# Patient Record
Sex: Female | Born: 1995 | Race: White | Hispanic: No | Marital: Single | Smoking: Current every day smoker
Health system: Southern US, Community
[De-identification: ages and names within clinical notes are randomized; demographics above are authoritative.]

---

## 2016-02-08 ENCOUNTER — Encounter (HOSPITAL_COMMUNITY): Payer: Self-pay | Admitting: *Deleted

## 2016-02-08 ENCOUNTER — Emergency Department (HOSPITAL_COMMUNITY)
Admission: EM | Admit: 2016-02-08 | Discharge: 2016-02-08 | Disposition: A | Discharge status: Home / Self Care | Payer: Self-pay | Attending: Emergency Medicine | Admitting: Emergency Medicine

## 2016-02-08 ENCOUNTER — Emergency Department (HOSPITAL_COMMUNITY): Payer: Self-pay

## 2016-02-08 DIAGNOSIS — F17210 Nicotine dependence, cigarettes, uncomplicated: Secondary | ICD-10-CM | POA: Insufficient documentation

## 2016-02-08 DIAGNOSIS — K589 Irritable bowel syndrome without diarrhea: Secondary | ICD-10-CM

## 2016-02-08 DIAGNOSIS — F419 Anxiety disorder, unspecified: Secondary | ICD-10-CM

## 2016-02-08 LAB — BASIC METABOLIC PANEL
ANION GAP: 10 (ref 5–15)
BUN: 6 mg/dL (ref 6–20)
CALCIUM: 9.7 mg/dL (ref 8.9–10.3)
CO2: 24 mmol/L (ref 22–32)
CREATININE: 0.75 mg/dL (ref 0.44–1.00)
Chloride: 108 mmol/L (ref 101–111)
Glucose, Bld: 99 mg/dL (ref 65–99)
Potassium: 3.8 mmol/L (ref 3.5–5.1)
SODIUM: 142 mmol/L (ref 135–145)

## 2016-02-08 LAB — CBC
HCT: 39 % (ref 36.0–46.0)
HEMOGLOBIN: 13.5 g/dL (ref 12.0–15.0)
MCH: 32.1 pg (ref 26.0–34.0)
MCHC: 34.6 g/dL (ref 30.0–36.0)
MCV: 92.6 fL (ref 78.0–100.0)
PLATELETS: 337 10*3/uL (ref 150–400)
RBC: 4.21 MIL/uL (ref 3.87–5.11)
RDW: 12.9 % (ref 11.5–15.5)
WBC: 8 10*3/uL (ref 4.0–10.5)

## 2016-02-08 LAB — I-STAT TROPONIN, ED: TROPONIN I, POC: 0 ng/mL (ref 0.00–0.08)

## 2016-02-08 MED ORDER — LORAZEPAM 0.5 MG PO TABS
0.5000 mg | ORAL_TABLET | Freq: Once | ORAL | Status: AC
  Administered 2016-02-08: 0.5 mg via ORAL
  Filled 2016-02-08: qty 1

## 2016-02-08 MED ORDER — DICYCLOMINE HCL 20 MG PO TABS: 20.0000 mg | ORAL_TABLET | Freq: Two times a day (BID) | ORAL | Status: AC

## 2016-02-08 MED ORDER — LORAZEPAM 1 MG PO TABS: 0.5000 mg | ORAL_TABLET | Freq: Three times a day (TID) | ORAL | Status: AC | PRN

## 2016-02-08 NOTE — Discharge Instructions (Signed)
Generalized Anxiety Disorder Generalized anxiety disorder (GAD) is a mental disorder. It interferes with life functions, including relationships, work, and school. GAD is different from normal anxiety, which everyone experiences at some point in their lives in response to specific life events and activities. Normal anxiety actually helps [place] prepare for and get through these life events and activities. Normal anxiety goes away after the event or activity is over.  GAD causes anxiety that is not necessarily related to specific events or activities. It also causes excess anxiety in proportion to specific events or activities. The anxiety associated with GAD is also difficult to control. GAD can vary from mild to severe. People with severe GAD can have intense waves of anxiety with physical symptoms (panic attacks).  SYMPTOMS The anxiety and worry associated with GAD are difficult to control. This anxiety and worry are related to many life events and activities and also occur more days than not for 6 months or longer. People with GAD also have three or more of the following symptoms (one or more in children):  Restlessness.   Fatigue.  Difficulty concentrating.   Irritability.  Muscle tension.  Difficulty sleeping or unsatisfying sleep. DIAGNOSIS GAD is diagnosed through an assessment by your health care provider. Your health care provider will ask you questions aboutyour mood,physical symptoms, and events in your life. Your health care provider may ask you about your medical history and use of alcohol or drugs, including prescription medicines. Your health care provider may also do a physical exam and blood tests. Certain medical conditions and the use of certain substances can cause symptoms similar to those associated with GAD. Your health care provider may refer you to a mental health specialist for further evaluation. TREATMENT The following therapies are usually used to treat GAD:    Medication. Antidepressant medication usually is prescribed for long-term daily control. Antianxiety medicines may be added in severe cases, especially when panic attacks occur.   Talk therapy (psychotherapy). Certain types of talk therapy can be helpful in treating GAD by providing support, education, and guidance. A form of talk therapy called cognitive behavioral therapy can teach you healthy ways to think about and react to daily life events and activities.  Stress managementtechniques. These include yoga, meditation, and exercise and can be very helpful when they are practiced regularly. A mental health specialist can help determine which treatment is best for you. Some people see improvement with one therapy. However, other people require a combination of therapies.   This information is not intended to replace advice given to you by your health care provider. Make sure you discuss any questions you have with your health care provider.   Document Released: 04/08/2013 Document Revised: 01/02/2015 Document Reviewed: 04/08/2013 Elsevier Interactive Patient Education 2016 [employer]  Irritable Bowel Syndrome, Adult Irritable bowel syndrome (IBS) is not one specific disease. It is a group of symptoms that affects the organs responsible for digestion (gastrointestinal or GI tract).  To regulate how your GI tract works, your body sends signals back and forth between your intestines and your brain. If you have IBS, there may be a problem with these signals. As a result, your GI tract does not function normally. Your intestines may become more sensitive and overreact to certain things. This is especially true when you eat certain foods or when you are under stress.  There are four types of IBS. These may be determined based on the consistency of your stool:   IBS with diarrhea.  IBS with constipation.   Mixed IBS.   Unsubtyped IBS.  It is important to know which type of IBS you  have. Some treatments are more likely to be helpful for certain types of IBS.  CAUSES  The exact cause of IBS is not known. RISK FACTORS You may have a higher risk of IBS if:  You are a woman.  You are younger than 22 years old.  You have a family history of IBS.  You have mental health problems.  You have had bacterial infection of your GI tract. SIGNS AND SYMPTOMS  Symptoms of IBS vary from person to person. The main symptom is abdominal pain or discomfort. Additional symptoms usually include one or more of the following:   Diarrhea, constipation, or both.   Abdominal swelling or bloating.   Feeling full or sick after eating a small or regular-size meal.   Frequent gas.   Mucus in the stool.   A feeling of having more stool left after a bowel movement.  Symptoms tend to come and go. They may be associated with stress, psychiatric conditions, or nothing at all.  DIAGNOSIS  There is no specific test to diagnose IBS. Your health care provider will make a diagnosis based on a physical exam, medical history, and your symptoms. You may have other tests to rule out other conditions that may be causing your symptoms. These may include:   Blood tests.   X-rays.   CT scan.  Endoscopy and colonoscopy. This is a test in which your GI tract is viewed with a long, thin, flexible tube. TREATMENT There is no cure for IBS, but treatment can help relieve symptoms. IBS treatment often includes:   Changes to your diet, such as:  Eating more fiber.  Avoiding foods that cause symptoms.  Drinking more water.  Eating regular, medium-sized portioned meals.  Medicines. These may include:  Fiber supplements if you have constipation.  Medicine to control diarrhea (antidiarrheal medicines).  Medicine to help control muscle spasms in your GI tract (antispasmodic medicines).  Medicines to help with any mental health issues, such as antidepressants or  tranquilizers.  Therapy.  Talk therapy may help with anxiety, depression, or other mental health issues that can make IBS symptoms worse.  Stress reduction.  Managing your stress can help keep symptoms under control. HOME CARE INSTRUCTIONS   Take medicines only as directed by your health care provider.  Eat a healthy diet.  Avoid foods and drinks with added sugar.  Include more whole grains, fruits, and vegetables gradually into your diet. This may be especially helpful if you have IBS with constipation.  Avoid any foods and drinks that make your symptoms worse. These may include dairy products and caffeinated or carbonated drinks.  Do not eat large meals.  Drink enough fluid to keep your urine clear or pale yellow.  Exercise regularly. Ask your health care provider for recommendations of good activities for you.  Keep all follow-up visits as directed by your health care provider. This is important. SEEK MEDICAL CARE IF:   You have constant pain.  You have trouble or pain with swallowing.  You have worsening diarrhea. SEEK IMMEDIATE MEDICAL CARE IF:   You have severe and worsening abdominal pain.   You have diarrhea and:   You have a rash, stiff neck, or severe headache.   You are irritable, sleepy, or difficult to awaken.   You are weak, dizzy, or extremely thirsty.   You have bright red blood in  your stool or you have black tarry stools.   You have unusual abdominal swelling that is painful.   You vomit continuously.   You vomit blood (hematemesis).   You have both abdominal pain and a fever.    This information is not intended to replace advice given to you by your health care provider. Make sure you discuss any questions you have with your health care provider.   Follow up with [facility] to establish primary care. Take Bentyl and Ativan as prescribed. Return to the ED if you experience severe worsening of your symptoms,  increased chest pain, difficulty breathing, loss of consciousness, dizziness, lower extremity swelling, blood in stool, abdominal pain, fever.

## 2016-02-08 NOTE — Progress Notes (Signed)
Entered in d/c instruction [facility] on 02/09/2016 [address] [place] [id number] [phone] Please use the resources provided to you in emergency room by case manager to assist with doctor for follow up On 02/11/2016 A referral for you has been sent to [facility] if you have not received a call in 3 days you may contact them Call Scherry Ran at [phone] Tuesday-Friday [email or web address] These [place] uninsured resources provide possible primary care providers, resources for discounted medications, housing, dental resources, affordable care act information, plus other resources for [facility]

## 2016-02-08 NOTE — Progress Notes (Signed)
CM spoke with pt who confirms uninsured [place] resident with no pcp.  CM discussed and provided written information for uninsured accepting pcps, discussed the importance of pcp vs EDP services for f/u care, www.needymeds.org, www.goodrx.com, discounted pharmacies and other [place] resources such as [facility] , [facility], affordable care act, financial assistance, uninsured dental services, [place] med assist, DSS and  health department  Reviewed resources for [place] uninsured accepting pcps like [facility], [facility] [address], [facility], [facility], local urgent care centers, [facility], [facility], [facility], [facility], [facility] plus others, medication resources, [facility] out patient pharmacies and housing Pt voiced understanding and appreciation of resources provided   Provided [facility] contact information Pt agreed to a referral Cm completed referral Pt to be contact by [facility] clinical liason  

## 2016-02-08 NOTE — Progress Notes (Signed)
Pt states her mother has an orange card and she is familiar with the program  Pt requested a dr noted for her and female visitor who brought her to ED CM updated ED RN and ED PA/ NP

## 2016-02-08 NOTE — ED Provider Notes (Signed)
CSN: [id number]     Arrival date & time 02/08/16  1227 History  By signing my name below, I, Placido Sou, attest that this documentation has been prepared under the direction and in the presence of [employer], PA-C. Electronically Signed: Placido Sou, ED Scribe. 02/08/2016. 5:03 PM.    Chief Complaint  Patient presents with  . Chest Pain  . Anxiety   The history is provided by the patient. No language interpreter was used.    HPI Comments: Tina Hogan is a 20 y.o. female who presents to the Emergency Department complaining of intermittent, n/v beginning 6 months ago and occuring almost daily each morning. She notes it initially began with vomiting in the mornings that has worsened to include trace amounts of blood in her vomit, decreased appetite, intermittent diarrhea, gradual weight loss, and recently began noticing mild, intermittent, centralized CP that began 1 week ago and typically presents in the mornings and during work and lasts for ~2 hours before gradually alleviating. She describes her pain as a "pressure". She typically feels better s/p vomiting. 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 She has never been dx or treated for anxiety. Pt denies having a PCP or health coverage. She denies a hx of drug or ETOH abuse. Pt denies SOB or any other symptoms at this time.    History reviewed. No pertinent past medical history. History reviewed. No pertinent past surgical history. No family history on file. Social History  Substance Use Topics  . Smoking status: Current Every Day Smoker -- 0.50 packs/day    Types: Cigarettes  . Smokeless tobacco: None  . Alcohol Use: Yes   OB History    No data available     Review of Systems A complete 10 system review of systems was obtained and all systems are negative except as noted in the HPI and PMH.   Allergies  Review of patient's allergies indicates no known allergies.  Home Medications    Prior to Admission medications   Medication Sig Start Date End Date Taking? Authorizing Provider  ibuprofen (ADVIL,MOTRIN) 200 MG tablet Take 400 mg by mouth every 6 (six) hours as needed for headache, mild pain or moderate pain.   Yes Historical Provider, MD   BP 127/75 mmHg  Pulse 90  Temp(Src) 98.2 F (36.8 C) (Oral)  Resp 18  SpO2 100%  LMP     Physical Exam  Constitutional: She is oriented to person, place, and time. She appears well-developed and well-nourished. No distress.  HENT:  Head: Normocephalic and atraumatic.  Eyes: Conjunctivae and EOM are normal. Pupils are equal, round, and reactive to light. Right eye exhibits no discharge. Left eye exhibits no discharge. No scleral icterus.  Neck: Normal range of motion. Neck supple. No JVD present.  Cardiovascular: Normal rate, regular rhythm, normal heart sounds and intact distal pulses.  Exam reveals no gallop and no friction rub.   No murmur heard. Pulmonary/Chest: Effort normal and breath sounds normal. No respiratory distress. She has no wheezes. She has no rales. She exhibits no tenderness.  Abdominal: Soft. Bowel sounds are normal. She exhibits no distension and no mass. There is no tenderness. There is no rebound and no guarding.  Neurological: She is alert and oriented to person, place, and time. She exhibits normal muscle tone. Coordination normal.  Strength 5/5 throughout. No sensory deficits.  No gait abnormality.  Skin: Skin is warm and dry. No rash noted. She is not diaphoretic. No erythema. No  pallor.  Psychiatric: She has a normal mood and affect. Her behavior is normal.  Nursing note and vitals reviewed.   ED Course  Procedures  DIAGNOSTIC STUDIES: Oxygen Saturation is 100% on RA, normal by my interpretation.    COORDINATION OF CARE: 4:51 PM Discussed next steps with pt. She verbalized understanding and is agreeable with the plan.   Labs Review Labs Reviewed  BASIC METABOLIC PANEL  CBC  I-STAT  TROPOININ, ED    Imaging Review Dg Chest 2 View  02/08/2016  CLINICAL DATA:  Chest pain and shortness of breath. EXAM: CHEST  2 VIEW COMPARISON:  None. FINDINGS: The act IMPRESSION: No active cardiopulmonary disease. Electronically Signed   By: Francene Boyers M.D.   On: 02/08/2016 14:21   I have personally reviewed and evaluated these images and lab results as part of my medical decision-making.   EKG Interpretation   Date/Time:  Monday February 08 2016 13:27:14 EST Ventricular Rate:  62 PR Interval:  121 QRS Duration: 91 QT Interval:  381 QTC Calculation: 387 R Axis:   87 Text Interpretation:  Sinus rhythm ED PHYSICIAN INTERPRETATION AVAILABLE  IN [facility] Confirmed by TEST, Record ([id number]) on 02/09/2016 6:58:57  AM      MDM   Final diagnoses:  Anxiety  Irritable bowel syndrome (IBS)    Otherwise heathy 20 y.o F presents to the ED with multiple complaints today. Pt states that for the last 6 months she has had upset stomach, diarrhea and crampy abdominal pain that resolves after BM. For the last week pt has had chest tightness that occurs in the morning and resolves throughout the day. No active chest pain in ED. No hypoxia, no tachycardia. Pt states that she feels very anxious and feels that her symptoms are most likely due to her anxiety. Pt states her job is very stressful. Pt given atican in ED with symptomatic relief . EKG wnl. Troponin wnl. CXR wnl. Low HEART score. PERC negative. Low suspicion ACS. All lab work wnl. Pt does not have insurance or PCP. Case mgmt consulted to help pt get set up with PCP and orange card. Will d/c home with short course ativan, no hx substance abuse, and Bentyl. Pt will follow up with PCP. Return precautions outlined in patient discharge instructions.    I personally performed the services described in this documentation, which was scribed in my presence. The recorded information has been reviewed and is accurate.     Lester Kinsman  [place], PA-C 02/10/16 1559  Benjiman Core, MD 02/11/16 [phone]

## 2016-02-08 NOTE — ED Notes (Signed)
Pt reports she's been having what she thinks is anxiety for 6 months along with no appetite.  Pt also reports recently started to have intermittent cp - which started about a week ago.  Pt reports cp only occurs in the morning.

## 2016-08-16 IMAGING — CR DG CHEST 2V
2 series · 2 of 2 positions shown · non-contrast
Comparison: None.

CLINICAL DATA: Chest pain and shortness of breath.

EXAM:
CHEST  2 VIEW

[w chest pa]
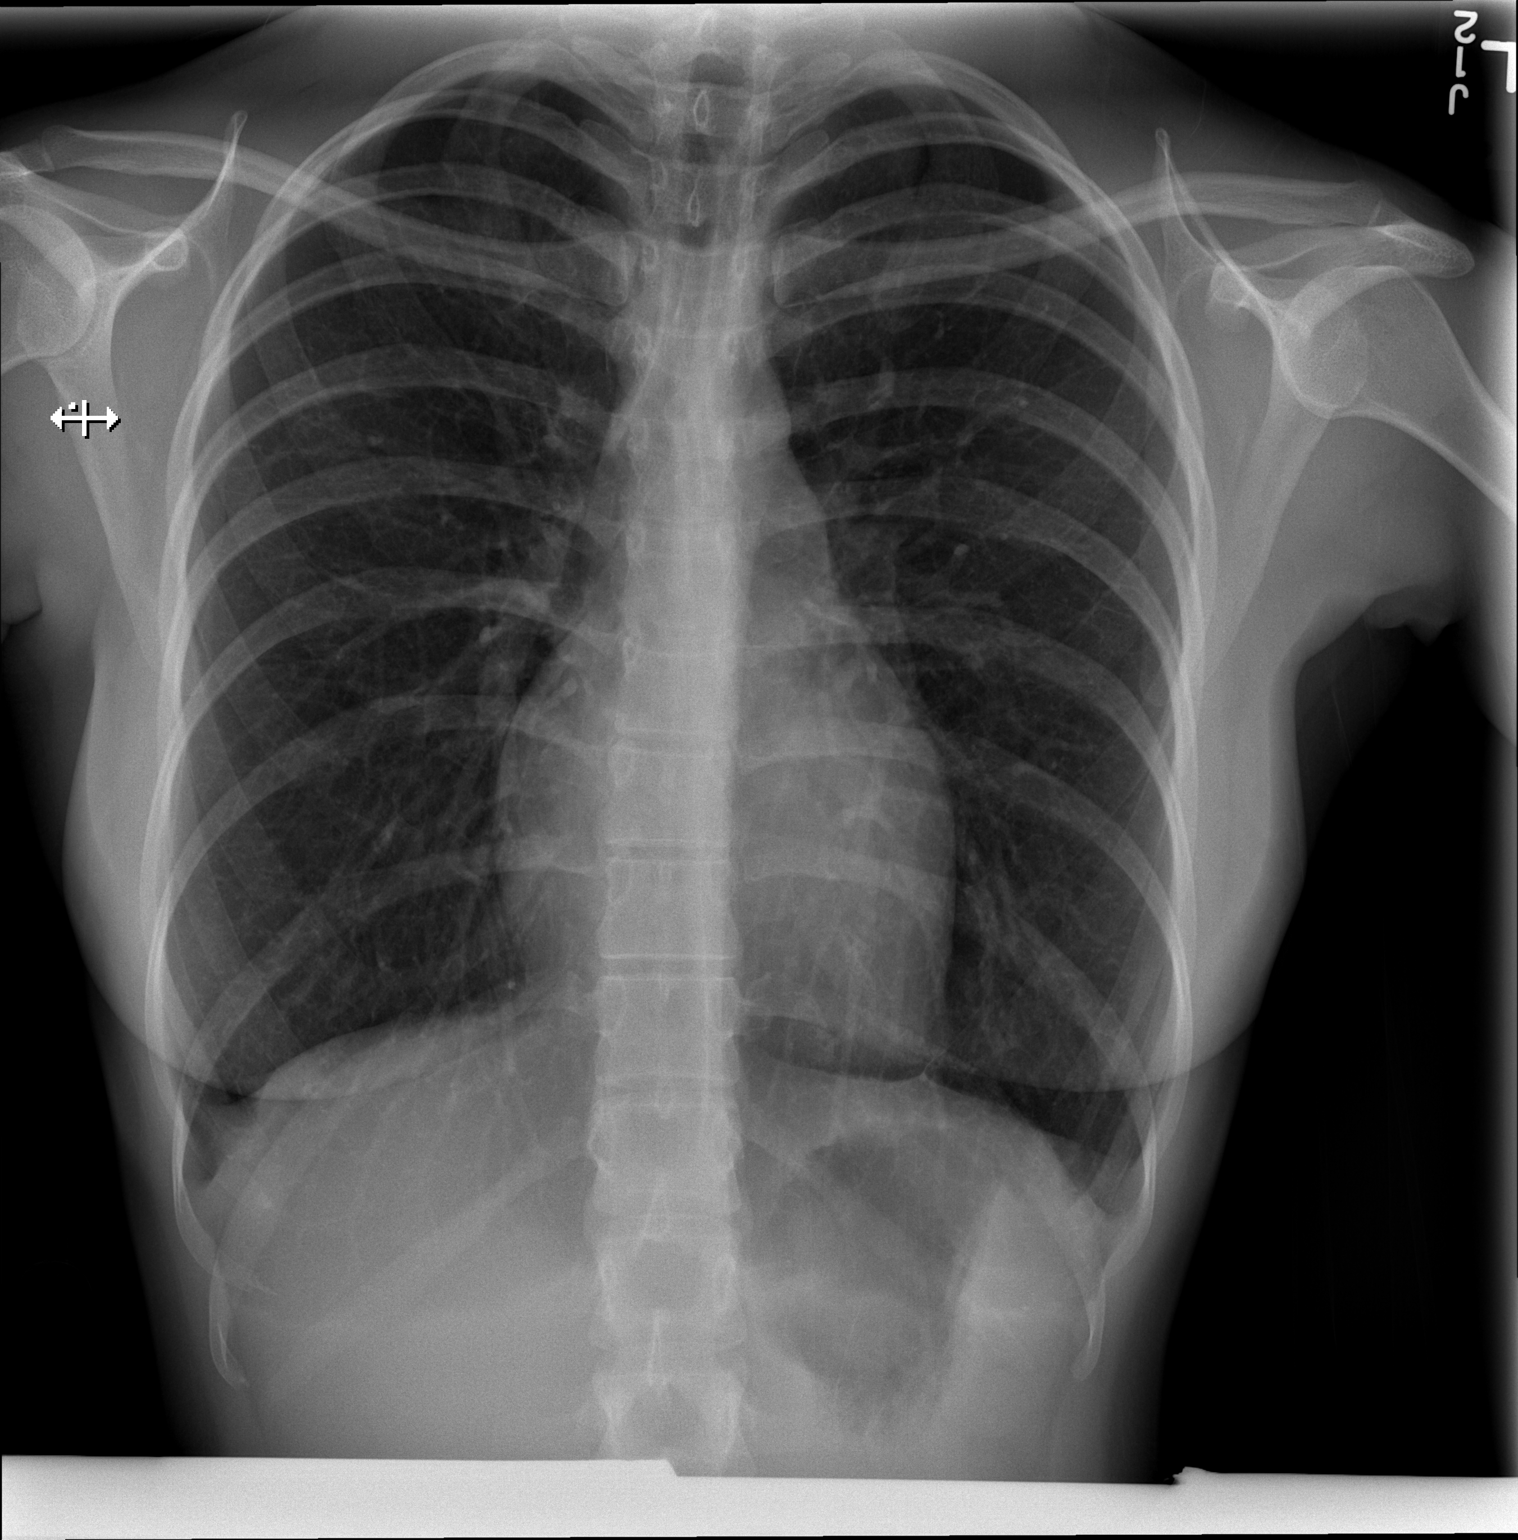

[w chest lat]
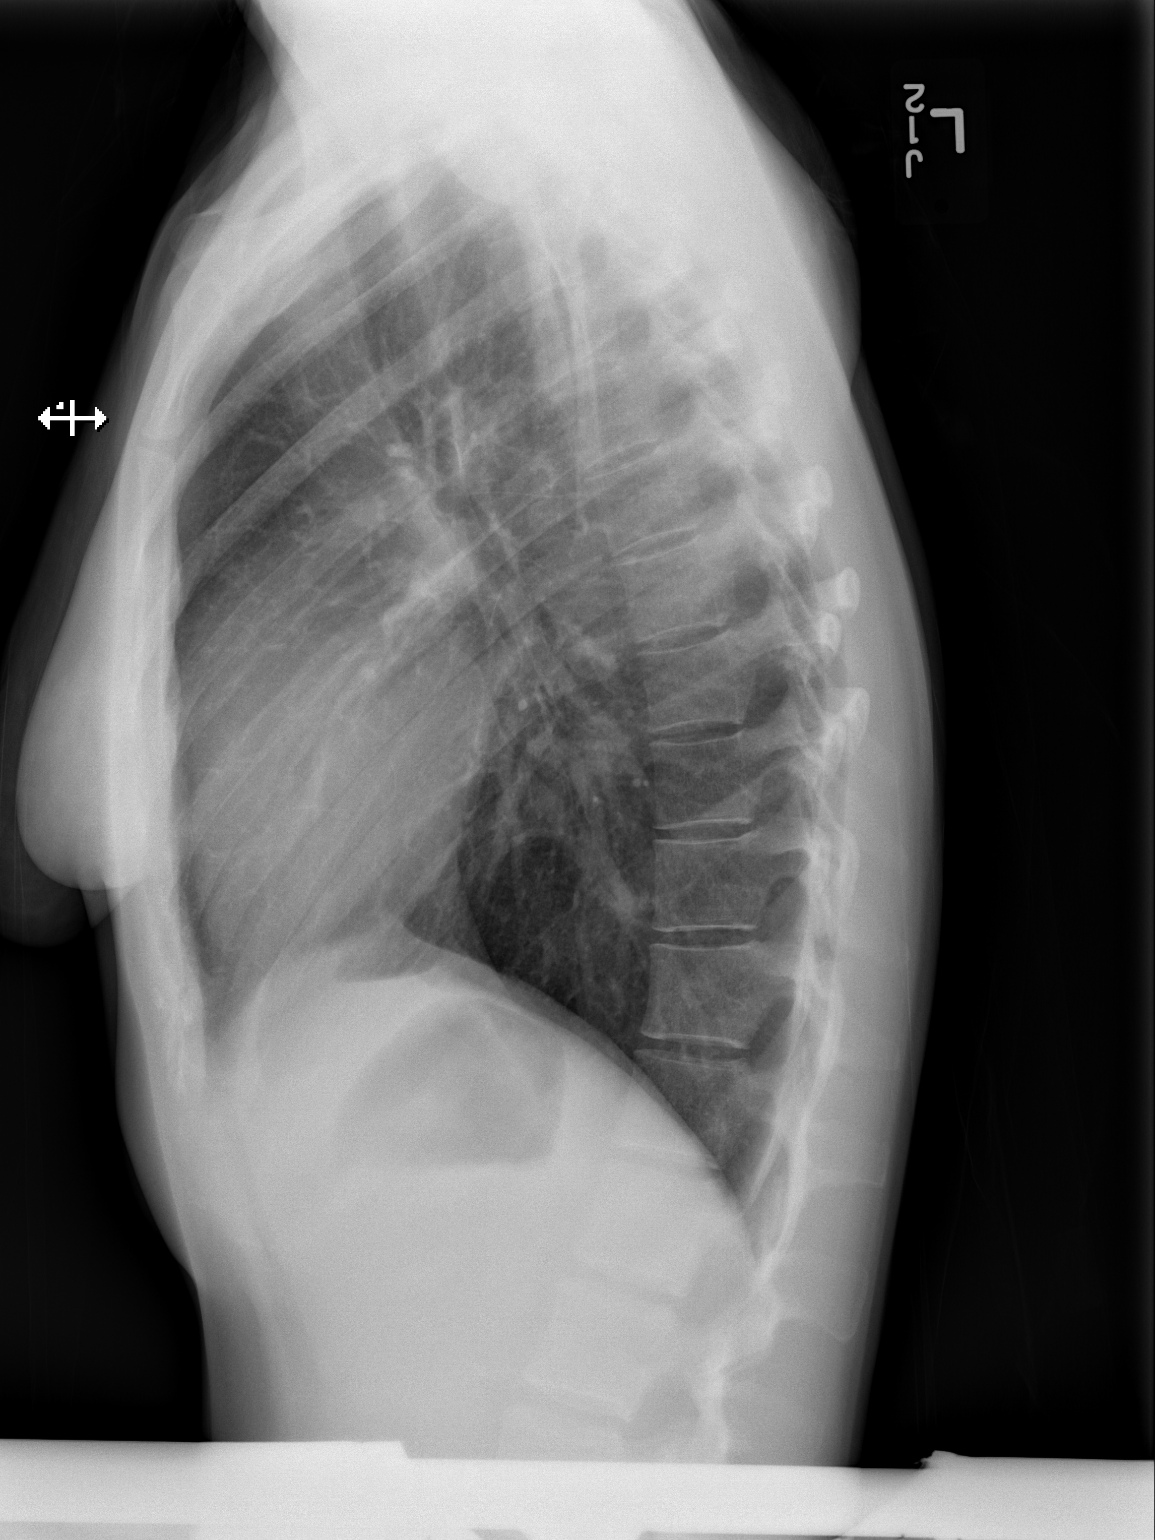

[2 of 2 positions shown; findings below may reference images not displayed]

FINDINGS: The act
IMPRESSION: No active cardiopulmonary disease.
